# Patient Record
Sex: Female | Born: 1990 | Race: Black or African American | Hispanic: No | Marital: Single | Smoking: Never smoker
Health system: Southern US, Community
[De-identification: ages and names within clinical notes are randomized; demographics above are authoritative.]

## PROBLEM LIST (undated history)

## (undated) DIAGNOSIS — J45909 Unspecified asthma, uncomplicated: Secondary | ICD-10-CM

---

## 2016-10-27 ENCOUNTER — Encounter: Payer: Self-pay | Admitting: Internal Medicine

## 2016-10-31 ENCOUNTER — Institutional Professional Consult (permissible substitution): Payer: Self-pay | Admitting: Internal Medicine

## 2016-11-29 ENCOUNTER — Institutional Professional Consult (permissible substitution): Payer: Self-pay | Admitting: Internal Medicine

## 2016-12-21 ENCOUNTER — Institutional Professional Consult (permissible substitution): Payer: Self-pay | Admitting: Pulmonary Disease

## 2016-12-21 NOTE — Progress Notes (Deleted)
   Subjective:    Patient ID: Sherri Warner, female    DOB: May 30, 1990, 26 y.o.   MRN: [id number]  Synopsis: Referred in 2018 for***  HPI No chief complaint on file.    No past medical history on file.   No family history on file.   Social History   Socioeconomic History  . Marital status: Not on file    Spouse name: Not on file  . Number of children: Not on file  . Years of education: Not on file  . Highest education level: Not on file  Social Needs  . Financial resource strain: Not on file  . Food insecurity - worry: Not on file  . Food insecurity - inability: Not on file  . Transportation needs - medical: Not on file  . Transportation needs - non-medical: Not on file  Occupational History  . Not on file  Tobacco Use  . Smoking status: Not on file  Substance and Sexual Activity  . Alcohol use: Not on file  . Drug use: Not on file  . Sexual activity: Not on file  Other Topics Concern  . Not on file  Social History Narrative  . Not on file     Allergies not on file   No outpatient medications prior to visit.   No facility-administered medications prior to visit.       Review of Systems     Objective:   Physical Exam  There were no vitals filed for this visit.   CBC No results found for: WBC, RBC, HGB, HCT, PLT, MCV, MCH, MCHC, RDW, LYMPHSABS, MONOABS, EOSABS, BASOSABS   BMET No results found for: NA, K, CL, CO2, GLUCOSE, BUN, CREATININE, CALCIUM, GFRNONAA, GFRAA       Assessment & Plan:    No diagnosis found.  Discussion: ***   No current outpatient medications on file.

## 2017-02-18 ENCOUNTER — Other Ambulatory Visit: Payer: Self-pay

## 2017-02-18 ENCOUNTER — Encounter (HOSPITAL_COMMUNITY): Payer: Self-pay

## 2017-02-18 ENCOUNTER — Emergency Department (HOSPITAL_COMMUNITY): Payer: No Typology Code available for payment source

## 2017-02-18 ENCOUNTER — Emergency Department (HOSPITAL_COMMUNITY)
Admission: EM | Admit: 2017-02-18 | Discharge: 2017-02-18 | Disposition: A | Discharge status: Home / Self Care | Payer: No Typology Code available for payment source | Attending: Emergency Medicine | Admitting: Emergency Medicine

## 2017-02-18 DIAGNOSIS — S161XXA Strain of muscle, fascia and tendon at neck level, initial encounter: Secondary | ICD-10-CM

## 2017-02-18 DIAGNOSIS — R51 Headache: Secondary | ICD-10-CM | POA: Diagnosis present

## 2017-02-18 DIAGNOSIS — R519 Headache, unspecified: Secondary | ICD-10-CM

## 2017-02-18 MED ORDER — METHOCARBAMOL 500 MG PO TABS
1000.0000 mg | ORAL_TABLET | Freq: Once | ORAL | Status: AC
  Administered 2017-02-18: 1000 mg via ORAL
  Filled 2017-02-18: qty 2

## 2017-02-18 MED ORDER — METHOCARBAMOL 500 MG PO TABS: 500.0000 mg | ORAL_TABLET | Freq: Three times a day (TID) | ORAL | 0 refills | Status: DC | PRN

## 2017-02-18 MED ORDER — IBUPROFEN 800 MG PO TABS: 800.0000 mg | ORAL_TABLET | Freq: Three times a day (TID) | ORAL | 0 refills | Status: DC

## 2017-02-18 NOTE — ED Notes (Signed)
Called for triage x2
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
## 2017-02-18 NOTE — Discharge Instructions (Signed)
Motrin for pain.  Robaxin for muscle tightness or spasm.
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
## 2017-02-18 NOTE — ED Notes (Signed)
Patient transported to CT 

## 2017-02-20 LAB — POC OCCULT BLOOD, ED: Fecal Occult Bld: NEGATIVE

## 2017-08-03 ENCOUNTER — Emergency Department (HOSPITAL_COMMUNITY)
Admission: EM | Admit: 2017-08-03 | Discharge: 2017-08-03 | Disposition: A | Discharge status: Home / Self Care | Payer: BLUE CROSS/BLUE SHIELD | Attending: Emergency Medicine | Admitting: Emergency Medicine

## 2017-08-03 ENCOUNTER — Other Ambulatory Visit: Payer: Self-pay

## 2017-08-03 ENCOUNTER — Encounter (HOSPITAL_COMMUNITY): Payer: Self-pay | Admitting: Emergency Medicine

## 2017-08-03 DIAGNOSIS — N3001 Acute cystitis with hematuria: Secondary | ICD-10-CM | POA: Diagnosis not present

## 2017-08-03 DIAGNOSIS — Z87898 Personal history of other specified conditions: Secondary | ICD-10-CM | POA: Insufficient documentation

## 2017-08-03 DIAGNOSIS — R1084 Generalized abdominal pain: Secondary | ICD-10-CM

## 2017-08-03 LAB — CBC
HCT: 37 % (ref 36.0–46.0)
Hemoglobin: 12.3 g/dL (ref 12.0–15.0)
MCH: 29.6 pg (ref 26.0–34.0)
MCHC: 33.2 g/dL (ref 30.0–36.0)
MCV: 89.2 fL (ref 78.0–100.0)
PLATELETS: 391 10*3/uL (ref 150–400)
RBC: 4.15 MIL/uL (ref 3.87–5.11)
RDW: 13.7 % (ref 11.5–15.5)
WBC: 5.4 10*3/uL (ref 4.0–10.5)

## 2017-08-03 LAB — COMPREHENSIVE METABOLIC PANEL
ALBUMIN: 3.7 g/dL (ref 3.5–5.0)
ALK PHOS: 75 U/L (ref 38–126)
ALT: 14 U/L (ref 14–54)
AST: 15 U/L (ref 15–41)
Anion gap: 6 (ref 5–15)
BUN: 11 mg/dL (ref 6–20)
CALCIUM: 9.1 mg/dL (ref 8.9–10.3)
CHLORIDE: 108 mmol/L (ref 101–111)
CO2: 27 mmol/L (ref 22–32)
CREATININE: 0.58 mg/dL (ref 0.44–1.00)
GFR calc non Af Amer: >60 mL/min (ref >60)
GLUCOSE: 96 mg/dL (ref 65–99)
Potassium: 4.2 mmol/L (ref 3.5–5.1)
SODIUM: 141 mmol/L (ref 135–145)
Total Bilirubin: 0.4 mg/dL (ref 0.3–1.2)
Total Protein: 7.3 g/dL (ref 6.5–8.1)

## 2017-08-03 LAB — URINALYSIS, ROUTINE W REFLEX MICROSCOPIC
Bilirubin Urine: NEGATIVE
Glucose, UA: NEGATIVE mg/dL
KETONES UR: NEGATIVE mg/dL
Nitrite: NEGATIVE
PH: 5 (ref 5.0–8.0)
PROTEIN: NEGATIVE mg/dL
Specific Gravity, Urine: 1.015 (ref 1.005–1.030)
Squamous Epithelial / LPF: >50 — ABNORMAL HIGH (ref 0–5)
WBC, UA: >50 WBC/hpf — ABNORMAL HIGH (ref 0–5)

## 2017-08-03 LAB — I-STAT BETA HCG BLOOD, ED (MC, WL, AP ONLY): I-stat hCG, quantitative: <5 m[IU]/mL (ref <5)

## 2017-08-03 LAB — LIPASE, BLOOD: LIPASE: 26 U/L (ref 11–51)

## 2017-08-03 MED ORDER — GI COCKTAIL ~~LOC~~
30.0000 mL | Freq: Once | ORAL | Status: AC
  Administered 2017-08-03: 30 mL via ORAL
  Filled 2017-08-03: qty 30

## 2017-08-03 MED ORDER — SUCRALFATE 1 G PO TABS: 1.0000 g | ORAL_TABLET | Freq: Three times a day (TID) | ORAL | 0 refills | Status: DC

## 2017-08-03 MED ORDER — CEPHALEXIN 500 MG PO CAPS: 1000.0000 mg | ORAL_CAPSULE | Freq: Two times a day (BID) | ORAL | 0 refills | Status: AC

## 2017-08-03 MED ORDER — OMEPRAZOLE 20 MG PO CPDR: 20.0000 mg | DELAYED_RELEASE_CAPSULE | Freq: Two times a day (BID) | ORAL | 0 refills | Status: DC

## 2017-08-03 NOTE — ED Triage Notes (Signed)
Per EMS pt complaint of mid abdominal pain throughout the night; denies n/v/d.

## 2017-08-03 NOTE — ED Provider Notes (Signed)
[facility]-EMERGENCY DEPT Provider Note   CSN: [id number] Arrival date & time: 08/03/17  1201     History   Chief Complaint Chief Complaint  Patient presents with  . Abdominal Pain    HPI Sherri Warner is a 27 y.o. female.  Patient presents with abdominal pain that is largely epigastric, started earlier today, without nausea vomiting or fever. She reports history stomach ulcer, [place]. She has been here for 2 years. She reports the pain is improved when she eats. No other symptoms, specifically no urinary symptoms, SOB, chest pain or back pain. She is not on any regular medications and has not tried anything to alleviate symptoms today.  The history is provided by the patient. No language interpreter was used.  Abdominal Pain   Pertinent negatives include fever, nausea, vomiting, dysuria and frequency.    History reviewed. No pertinent past medical history.  There are no active problems to display for this patient.   History reviewed. No pertinent surgical history.   OB History   None      Home Medications    Prior to Admission medications   Medication Sig Start Date End Date Taking? Authorizing Provider  ibuprofen (ADVIL,MOTRIN) 800 MG tablet Take 1 tablet (800 mg total) by mouth 3 (three) times daily. Patient taking differently: Take 800 mg by mouth 2 (two) times daily.  02/18/17   Rolland PorterJames, Mark, MD  methocarbamol (ROBAXIN) 500 MG tablet Take 1 tablet (500 mg total) by mouth 3 (three) times daily between meals as needed. Patient not taking: Reported on 08/03/2017 02/18/17   Rolland PorterJames, Mark, MD    Family History No family history on file.  Social History Social History   Tobacco Use  . Smoking status: Never Smoker  . Smokeless tobacco: Never Used  Substance Use Topics  . Alcohol use: No    Frequency: Never  . Drug use: No     Allergies   Patient has no known allergies.   Review of Systems Review of Systems    Constitutional: Negative for chills and fever.  HENT: Negative.   Respiratory: Negative.  Negative for shortness of breath.   Cardiovascular: Negative.  Negative for chest pain.  Gastrointestinal: Positive for abdominal pain. Negative for nausea and vomiting.  Genitourinary: Negative.  Negative for dysuria and frequency.  Musculoskeletal: Negative.   Skin: Negative.   Neurological: Negative.      Physical Exam Updated Vital Signs BP 120/80   Pulse 72   Temp 98.1 F (36.7 C) (Oral)   Resp 14   SpO2 100%   Physical Exam  Constitutional: She appears well-developed and well-nourished.  HENT:  Head: Normocephalic.  Neck: Normal range of motion. Neck supple.  Cardiovascular: Normal rate and regular rhythm.  Pulmonary/Chest: Effort normal and breath sounds normal.  Abdominal: Soft. Bowel sounds are normal. There is generalized tenderness (Tenderness somewhat worse in epigastric area. ). There is no rebound and no guarding.  Musculoskeletal: Normal range of motion.  Neurological: She is alert. No cranial nerve deficit.  Skin: Skin is warm and dry. No rash noted.  Psychiatric: She has a normal mood and affect.     ED Treatments / Results  Labs (all labs ordered are listed, but only abnormal results are displayed) Labs Reviewed  URINALYSIS, ROUTINE W REFLEX MICROSCOPIC - Abnormal; Notable for the following components:      Result Value   APPearance CLOUDY (*)    Hgb urine dipstick SMALL (*)  Leukocytes, UA LARGE (*)    WBC, UA >50 (*)    Bacteria, UA FEW (*)    Squamous Epithelial / LPF >50 (*)    All other components within normal limits  LIPASE, BLOOD  COMPREHENSIVE METABOLIC PANEL  CBC  I-STAT BETA HCG BLOOD, ED ([facility], [facility], [facility] ONLY)    EKG None  Radiology No results found.  Procedures Procedures (including critical care time)  Medications Ordered in ED Medications - No data to display   Initial Impression / Assessment and Plan / ED Course  I have  reviewed the triage vital signs and the nursing notes.  Pertinent labs & imaging results that were available during my care of the patient were reviewed by me and considered in my medical decision making (see chart for details).     Patient here with abdominal pain that is familiar to her as ulcer pain she has had in the past. No nausea, vomiting, fever, melena  GI cocktail provided with relief of abdominal discomfort. Will refer to GI for further management of possible recurrent ulcer disease. Will start on Prilosec and carafate.   Labs are essentially normal with some evidence UTI. Given >50 RBC and WBC, will provide Keflex x 3 days.   She is felt stable for discharge home.   Final Clinical Impressions(s) / ED Diagnoses   Final diagnoses:  None   1. Abdominal pain 2. History of ulcer disease 3. UTI   ED Discharge Orders    None       Elpidio Anis, Cordelia Poche 08/03/17 1558    Arby Barrette, MD 08/03/17 [phone]

## 2017-08-03 NOTE — Discharge Instructions (Addendum)
Follow up with Gastroenterology for further evaluation of abdominal pain. Take medications as prescribed. Also, it is recommended that you get established with a primary care doctor for routine medical concerns.

## 2017-11-03 ENCOUNTER — Encounter (HOSPITAL_COMMUNITY): Payer: Self-pay | Admitting: Emergency Medicine

## 2017-11-03 ENCOUNTER — Other Ambulatory Visit: Payer: Self-pay

## 2017-11-03 ENCOUNTER — Emergency Department (HOSPITAL_COMMUNITY)
Admission: EM | Admit: 2017-11-03 | Discharge: 2017-11-03 | Disposition: A | Discharge status: Home / Self Care | Payer: BLUE CROSS/BLUE SHIELD | Attending: Emergency Medicine | Admitting: Emergency Medicine

## 2017-11-03 DIAGNOSIS — J069 Acute upper respiratory infection, unspecified: Secondary | ICD-10-CM | POA: Diagnosis not present

## 2017-11-03 DIAGNOSIS — B9789 Other viral agents as the cause of diseases classified elsewhere: Secondary | ICD-10-CM | POA: Diagnosis not present

## 2017-11-03 DIAGNOSIS — R05 Cough: Secondary | ICD-10-CM | POA: Diagnosis present

## 2017-11-03 MED ORDER — FLUTICASONE PROPIONATE 50 MCG/ACT NA SUSP: 2.0000 | Freq: Every day | NASAL | 0 refills | Status: DC

## 2017-11-03 MED ORDER — BENZONATATE 100 MG PO CAPS: 100.0000 mg | ORAL_CAPSULE | Freq: Three times a day (TID) | ORAL | 0 refills | Status: DC

## 2017-11-03 MED ORDER — ALBUTEROL SULFATE HFA 108 (90 BASE) MCG/ACT IN AERS
2.0000 | INHALATION_SPRAY | Freq: Once | RESPIRATORY_TRACT | Status: AC
  Administered 2017-11-03: 2 via RESPIRATORY_TRACT
  Filled 2017-11-03: qty 6.7

## 2017-11-03 NOTE — ED Provider Notes (Signed)
[facility]-EMERGENCY DEPT Provider Note   CSN: [id number] Arrival date & time: 11/03/17  1847     History   Chief Complaint Chief Complaint  Patient presents with  . Nasal Congestion    HPI Sherri Warner is a 27 y.o. female.  HPI   Patient is a 27 year old female who presents emergency department today for evaluation of rhinorrhea, nasal congestion, postnasal drip, sore throat and a dry cough that began yesterday.  Denies any documented fevers at home.  States she is to have a history of asthma and needs to take inhalers but she ran out.  Denies any exacerbating or alleviating factors.  Has not taken any medication for her symptoms.  History reviewed. No pertinent past medical history.  There are no active problems to display for this patient.   History reviewed. No pertinent surgical history.   OB History   None      Home Medications    Prior to Admission medications   Medication Sig Start Date End Date Taking? Authorizing Provider  benzonatate (TESSALON) 100 MG capsule Take 1 capsule (100 mg total) by mouth every 8 (eight) hours. 11/03/17   Catherina Pates S, PA-C  fluticasone (FLONASE) 50 MCG/ACT nasal spray Place 2 sprays into both nostrils daily. 11/03/17   Sumaiyah Markert S, PA-C  ibuprofen (ADVIL,MOTRIN) 800 MG tablet Take 1 tablet (800 mg total) by mouth 3 (three) times daily. Patient taking differently: Take 800 mg by mouth 2 (two) times daily.  02/18/17   Rolland PorterJames, Mark, MD  methocarbamol (ROBAXIN) 500 MG tablet Take 1 tablet (500 mg total) by mouth 3 (three) times daily between meals as needed. Patient not taking: Reported on 08/03/2017 02/18/17   Rolland PorterJames, Mark, MD  omeprazole (PRILOSEC) 20 MG capsule Take 1 capsule (20 mg total) by mouth 2 (two) times daily before a meal. 08/03/17   Upstill, Melvenia BeamShari, PA-C  sucralfate (CARAFATE) 1 g tablet Take 1 tablet (1 g total) by mouth 4 (four) times daily -  with meals and at bedtime. 08/03/17   Elpidio AnisUpstill, Shari,  PA-C    Family History No family history on file.  Social History Social History   Tobacco Use  . Smoking status: Never Smoker  . Smokeless tobacco: Never Used  Substance Use Topics  . Alcohol use: No    Frequency: Never  . Drug use: No     Allergies   Patient has no known allergies.   Review of Systems Review of Systems  Constitutional: Negative for chills and fever.  HENT: Positive for congestion, postnasal drip, rhinorrhea and sore throat. Negative for ear pain and trouble swallowing.   Eyes: Negative for pain and visual disturbance.  Respiratory: Positive for cough. Negative for shortness of breath.   Cardiovascular: Negative for chest pain.  Gastrointestinal: Negative for abdominal pain, constipation, diarrhea, nausea and vomiting.  Genitourinary: Negative for dysuria and hematuria.  Musculoskeletal: Negative for back pain.  Skin: Negative for rash.  Neurological: Positive for headaches (resolved).  All other systems reviewed and are negative.   Physical Exam Updated Vital Signs BP 114/79 (BP Location: Left Arm)   Pulse 88   Temp 97.6 F (36.4 C) (Oral)   Resp 13   SpO2 100%   Physical Exam  Constitutional: She appears well-developed and well-nourished. No distress.  HENT:  Head: Normocephalic and atraumatic.  Mouth/Throat: Oropharynx is clear and moist.  No pharyngeal erythema.  No tonsillar swelling or exudates.  Nasal turbinates are swollen bilaterally.  Bilateral TMs are  without erythema or effusion.  Eyes: Pupils are equal, round, and reactive to light. Conjunctivae and EOM are normal.  Neck: Neck supple.  Cardiovascular: Normal rate, regular rhythm and normal heart sounds.  No murmur heard. Pulmonary/Chest: Effort normal and breath sounds normal. No stridor. No respiratory distress. She has no wheezes.  Abdominal: Soft. Bowel sounds are normal. She exhibits no distension. There is no tenderness. There is no guarding.  Musculoskeletal: She exhibits  no edema.  Lymphadenopathy:    She has no cervical adenopathy.  Neurological: She is alert.  Skin: Skin is warm and dry.  Psychiatric: She has a normal mood and affect.  Nursing note and vitals reviewed.   ED Treatments / Results  Labs (all labs ordered are listed, but only abnormal results are displayed) Labs Reviewed - No data to display  EKG None  Radiology No results found.  Procedures Procedures (including critical care time)  Medications Ordered in ED Medications  albuterol (PROVENTIL HFA;VENTOLIN HFA) 108 (90 Base) MCG/ACT inhaler 2 puff (has no administration in time range)     Initial Impression / Assessment and Plan / ED Course  I have reviewed the triage vital signs and the nursing notes.  Pertinent labs & imaging results that were available during my care of the patient were reviewed by me and considered in my medical decision making (see chart for details).    Final Clinical Impressions(s) / ED Diagnoses   Final diagnoses:  Viral URI with cough    Patients symptoms are consistent with URI, likely viral etiology. sxs ongoing since yesterday. No fevers. Nontoxic and nonseptic appearing. Exam not consistent with strep throat. Discussed that antibiotics are not indicated for viral infections. Pt will be discharged with symptomatic treatment.  Verbalizes understanding and is agreeable with plan. Pt is hemodynamically stable & in NAD prior to dc.   ED Discharge Orders         Ordered    benzonatate (TESSALON) 100 MG capsule  Every 8 hours     11/03/17 2118    fluticasone (FLONASE) 50 MCG/ACT nasal spray  Daily     11/03/17 2118           Rayne Du 11/03/17 2123    Loren Racer, MD 11/03/17 2342

## 2017-11-03 NOTE — Discharge Instructions (Signed)

## 2017-11-03 NOTE — ED Triage Notes (Signed)
Pt arrives with complaints of sore throat from post-nasal drip and sinus infection symptoms. Patient states symptoms began yesterday. Patient complaining of a dry cough.

## 2018-02-02 ENCOUNTER — Emergency Department (HOSPITAL_COMMUNITY)
Admission: EM | Admit: 2018-02-02 | Discharge: 2018-02-02 | Disposition: A | Discharge status: Home / Self Care | Payer: BLUE CROSS/BLUE SHIELD | Attending: Emergency Medicine | Admitting: Emergency Medicine

## 2018-02-02 ENCOUNTER — Encounter (HOSPITAL_COMMUNITY): Payer: Self-pay | Admitting: Emergency Medicine

## 2018-02-02 ENCOUNTER — Emergency Department (HOSPITAL_COMMUNITY): Payer: BLUE CROSS/BLUE SHIELD

## 2018-02-02 ENCOUNTER — Other Ambulatory Visit: Payer: Self-pay

## 2018-02-02 DIAGNOSIS — R079 Chest pain, unspecified: Secondary | ICD-10-CM

## 2018-02-02 DIAGNOSIS — R0789 Other chest pain: Secondary | ICD-10-CM | POA: Insufficient documentation

## 2018-02-02 DIAGNOSIS — Z79899 Other long term (current) drug therapy: Secondary | ICD-10-CM | POA: Insufficient documentation

## 2018-02-02 DIAGNOSIS — R0600 Dyspnea, unspecified: Secondary | ICD-10-CM

## 2018-02-02 LAB — CBC
HEMATOCRIT: 38.7 % (ref 36.0–46.0)
Hemoglobin: 12.6 g/dL (ref 12.0–15.0)
MCH: 28.9 pg (ref 26.0–34.0)
MCHC: 32.6 g/dL (ref 30.0–36.0)
MCV: 88.8 fL (ref 80.0–100.0)
Platelets: 412 10*3/uL — ABNORMAL HIGH (ref 150–400)
RBC: 4.36 MIL/uL (ref 3.87–5.11)
RDW: 13.3 % (ref 11.5–15.5)
WBC: 5.6 10*3/uL (ref 4.0–10.5)
nRBC: 0 % (ref 0.0–0.2)

## 2018-02-02 LAB — BASIC METABOLIC PANEL
Anion gap: 9 (ref 5–15)
BUN: 10 mg/dL (ref 6–20)
CHLORIDE: 105 mmol/L (ref 98–111)
CO2: 26 mmol/L (ref 22–32)
CREATININE: 0.78 mg/dL (ref 0.44–1.00)
Calcium: 9.3 mg/dL (ref 8.9–10.3)
GFR calc non Af Amer: >60 mL/min (ref >60)
GLUCOSE: 87 mg/dL (ref 70–99)
Potassium: 4.1 mmol/L (ref 3.5–5.1)
Sodium: 140 mmol/L (ref 135–145)

## 2018-02-02 LAB — I-STAT TROPONIN, ED
Troponin i, poc: 0 ng/mL (ref 0.00–0.08)
Troponin i, poc: 0 ng/mL (ref 0.00–0.08)

## 2018-02-02 LAB — I-STAT BETA HCG BLOOD, ED (MC, WL, AP ONLY)

## 2018-02-02 LAB — D-DIMER, QUANTITATIVE: D-Dimer, Quant: 0.72 ug/mL-FEU — ABNORMAL HIGH (ref 0.00–0.50)

## 2018-02-02 MED ORDER — SODIUM CHLORIDE 0.9 % IV BOLUS
1000.0000 mL | Freq: Once | INTRAVENOUS | Status: AC
  Administered 2018-02-02: 1000 mL via INTRAVENOUS

## 2018-02-02 MED ORDER — IOPAMIDOL (ISOVUE-370) INJECTION 76%
INTRAVENOUS | Status: AC
  Administered 2018-02-02: 100 mL
  Filled 2018-02-02: qty 100

## 2018-02-02 NOTE — ED Triage Notes (Signed)
Pt reports 5/10 central cp that started last night with sob.

## 2018-02-02 NOTE — ED Provider Notes (Signed)
[facility] Provider Note   CSN: [id number] Arrival date & time: 02/02/18  1717     History   Chief Complaint Chief Complaint  Patient presents with  . Chest Pain    HPI Sherri Warner is a 27 y.o. female with a past medical history of asthma, who presents to ED for chest pain and shortness of breath that has worsened since last night.  States that she has had intermittent central chest pain and shortness of breath with exertion for the past several months.  Pain is worse with inspiration.  She notices this during sexual intercourse, walking up the steps or at work.  She has tried taking Advil with no improvement in her symptoms.  She denies any leg swelling, hemoptysis, recent immobilization, OCP use, history of DVT, PE, MI, alcohol, tobacco or other drug use.  She states that about 6 years ago when she lived in [place], she "had some kind of heart thing that they gave me medicine for, but they said it was just the beginning of something."  She does not know exactly what her diagnosis was nor the medication that she was on.  She is no longer taking this medication. Patient states that this does not feel like it is due to her asthma.  HPI  History reviewed. No pertinent past medical history.  There are no active problems to display for this patient.   History reviewed. No pertinent surgical history.   OB History   No obstetric history on file.      Home Medications    Prior to Admission medications   Medication Sig Start Date End Date Taking? Authorizing Provider  benzonatate (TESSALON) 100 MG capsule Take 1 capsule (100 mg total) by mouth every 8 (eight) hours. 11/03/17   Couture, Cortni S, PA-C  fluticasone (FLONASE) 50 MCG/ACT nasal spray Place 2 sprays into both nostrils daily. 11/03/17   Couture, Cortni S, PA-C  ibuprofen (ADVIL,MOTRIN) 800 MG tablet Take 1 tablet (800 mg total) by mouth 3 (three) times daily. Patient taking  differently: Take 800 mg by mouth 2 (two) times daily.  02/18/17   Rolland PorterJames, Mark, MD  methocarbamol (ROBAXIN) 500 MG tablet Take 1 tablet (500 mg total) by mouth 3 (three) times daily between meals as needed. Patient not taking: Reported on 08/03/2017 02/18/17   Rolland PorterJames, Mark, MD  omeprazole (PRILOSEC) 20 MG capsule Take 1 capsule (20 mg total) by mouth 2 (two) times daily before a meal. 08/03/17   Upstill, Melvenia BeamShari, PA-C  sucralfate (CARAFATE) 1 g tablet Take 1 tablet (1 g total) by mouth 4 (four) times daily -  with meals and at bedtime. 08/03/17   Elpidio AnisUpstill, Shari, PA-C    Family History No family history on file.  Social History Social History   Tobacco Use  . Smoking status: Never Smoker  . Smokeless tobacco: Never Used  Substance Use Topics  . Alcohol use: No    Frequency: Never  . Drug use: No     Allergies   Patient has no known allergies.   Review of Systems Review of Systems  Constitutional: Negative for appetite change, chills and fever.  HENT: Negative for ear pain, rhinorrhea, sneezing and sore throat.   Eyes: Negative for photophobia and visual disturbance.  Respiratory: Positive for shortness of breath. Negative for cough, chest tightness and wheezing.   Cardiovascular: Positive for chest pain. Negative for palpitations.  Gastrointestinal: Negative for abdominal pain, blood in stool, constipation, diarrhea, nausea and  vomiting.  Genitourinary: Negative for dysuria, hematuria and urgency.  Musculoskeletal: Negative for myalgias.  Skin: Negative for rash.  Neurological: Negative for dizziness, weakness and light-headedness.     Physical Exam Updated Vital Signs BP 124/76   Pulse 83   Temp (!) 97.5 F (36.4 C) (Oral)   Resp 18   Ht 5\' 4"  (1.626 m)   Wt 71.7 kg   LMP 01/19/2018   SpO2 100%   BMI 27.12 kg/m   Physical Exam Vitals signs and nursing note reviewed.  Constitutional:      General: She is not in acute distress.    Appearance: She is well-developed.      Comments: Nontoxic-appearing in no acute distress.  Speaking in complete sentences without difficulty.  HENT:     Head: Normocephalic and atraumatic.     Nose: Nose normal.  Eyes:     General: No scleral icterus.       Left eye: No discharge.     Conjunctiva/sclera: Conjunctivae normal.  Neck:     Musculoskeletal: Normal range of motion and neck supple.  Cardiovascular:     Rate and Rhythm: Normal rate and regular rhythm.     Heart sounds: Normal heart sounds. No murmur. No friction rub. No gallop.   Pulmonary:     Effort: Pulmonary effort is normal. No respiratory distress.     Breath sounds: Normal breath sounds.  Abdominal:     General: Bowel sounds are normal. There is no distension.     Palpations: Abdomen is soft.     Tenderness: There is no abdominal tenderness. There is no guarding.  Musculoskeletal: Normal range of motion.     Comments: No lower extremity edema, erythema or calf tenderness bilaterally.  Skin:    General: Skin is warm and dry.     Findings: No rash.  Neurological:     Mental Status: She is alert.     Motor: No abnormal muscle tone.     Coordination: Coordination normal.      ED Treatments / Results  Labs (all labs ordered are listed, but only abnormal results are displayed) Labs Reviewed  CBC - Abnormal; Notable for the following components:      Result Value   Platelets 412 (*)    All other components within normal limits  D-DIMER, QUANTITATIVE (NOT AT [facility]) - Abnormal; Notable for the following components:   D-Dimer, Quant 0.72 (*)    All other components within normal limits  BASIC METABOLIC PANEL  I-STAT TROPONIN, ED  I-STAT BETA HCG BLOOD, ED ([facility], [facility], [facility] ONLY)  I-STAT TROPONIN, ED    EKG None  Radiology Dg Chest 2 View  Result Date: 02/02/2018 CLINICAL DATA:  Chest pain EXAM: CHEST - 2 VIEW COMPARISON:  None. FINDINGS: The heart size and mediastinal contours are within normal limits. Both lungs are clear. The visualized skeletal  structures are unremarkable. IMPRESSION: No active cardiopulmonary disease. Electronically Signed   By: Jasmine Pang M.D.   On: 02/02/2018 18:46    Procedures Procedures (including critical care time)  Medications Ordered in ED Medications  sodium chloride 0.9 % bolus 1,000 mL (1,000 mLs Intravenous New Bag/Given 02/02/18 2128)     Initial Impression / Assessment and Plan / ED Course  I have reviewed the triage vital signs and the nursing notes.  Pertinent labs & imaging results that were available during my care of the patient were reviewed by me and considered in my medical decision making (see chart for  details).     27 year old female presents to ED for intermittent chest pain for the past several months that has worsened since last night.  Pain is pleuritic in nature.  She reports associated shortness of breath worse with exertion as well.  No improvement with NSAIDs.  Denies any URI symptoms, leg swelling, cough, history of PE, DVT or MI.  However, she does state that she had some type of heart problem 6 years ago when she lived in [place] and she was treated with medication.  She is unable to recall the diagnosis or the medication.  She is no longer taking this medication.  On exam she is overall well-appearing.  She is not tachycardic, tachypneic or hypoxic.  No lower extremity edema, erythema or calf tenderness that would concern me for DVT.  Initial lab work including CBC, BMP, initial troponin and hCG are unremarkable.  EKG shows normal sinus rhythm.  Chest x-ray is unremarkable.  Patient appears low risk for cardiac or pulmonary cause of symptoms.  However, due to unknown past medical history will order repeat troponin and d-dimer as her chest pain is pleuritic.  Delta troponin is negative.  D-dimer elevated 2.72.  I have ordered a CT of her chest to rule out a PE. I anticipate dispo home if CTA returns as negative, as she is low risk by HEART score. Care handed off to Dr.  Effie ShyWentz.    Portions of this note were generated with Scientist, clinical (histocompatibility and immunogenetics)Dragon dictation software. Dictation errors may occur despite best attempts at proofreading.   Final Clinical Impressions(s) / ED Diagnoses   Final diagnoses:  Chest wall pain    ED Discharge Orders    None       Dietrich PatesKhatri, Randeep Biondolillo, PA-C 02/02/18 2158    Mancel BaleWentz, Elliott, MD 02/06/18 [phone]

## 2018-02-02 NOTE — ED Provider Notes (Signed)
  Face-to-face evaluation   History: She is here for evaluation of chest pain with shortness of breath.  Symptoms ongoing for several months.  Physical exam: Alert, calm, cooperative.  No respiratory distress.  No dysarthria or aphasia.  She is comfortable and calm.   Clinical Course as of Feb 02 2317  Fri Feb 02, 2018  2316 Normal  I-stat troponin, ED [EW]  2317 Normal  Basic metabolic panel [EW]  2317 High  D-dimer, quantitative (not at [facility])(!) [EW]  2317 Normal  I-Stat beta hCG blood, ED [EW]  2317 Normal  CBC(!) [EW]  2317 No PE, no clear evidence for infiltrate.  CT Angio Chest PE W/Cm &/Or Wo Cm [EW]  2317 DG Chest 2 View [EW]  2318 No pneumonia or CHF, images reviewed by me   [EW]    Clinical Course User Index [EW] Mancel BaleWentz, Alioune Hodgkin, MD    Medical screening examination/treatment/procedure(s) were conducted as a shared visit with non-physician practitioner(s) and myself.  I personally evaluated the patient during the encounter    Mancel BaleWentz, Ena Demary, MD 02/02/18 2321

## 2018-02-02 NOTE — Discharge Instructions (Addendum)
The testing today is normal.  We did not find any sign of heart or lung problems to explain your pain.  You can try taking Tylenol for pain.  Follow-up with your doctor if you are not better in 1 week.

## 2018-02-08 ENCOUNTER — Emergency Department (HOSPITAL_COMMUNITY)
Admission: EM | Admit: 2018-02-08 | Discharge: 2018-02-08 | Disposition: A | Discharge status: Home / Self Care | Payer: BLUE CROSS/BLUE SHIELD | Attending: Emergency Medicine | Admitting: Emergency Medicine

## 2018-02-08 ENCOUNTER — Encounter (HOSPITAL_COMMUNITY): Payer: Self-pay | Admitting: Emergency Medicine

## 2018-02-08 ENCOUNTER — Other Ambulatory Visit: Payer: Self-pay

## 2018-02-08 DIAGNOSIS — J029 Acute pharyngitis, unspecified: Secondary | ICD-10-CM

## 2018-02-08 DIAGNOSIS — J069 Acute upper respiratory infection, unspecified: Secondary | ICD-10-CM

## 2018-02-08 DIAGNOSIS — R509 Fever, unspecified: Secondary | ICD-10-CM | POA: Diagnosis present

## 2018-02-08 HISTORY — DX: Unspecified asthma, uncomplicated: J45.909

## 2018-02-08 LAB — GROUP A STREP BY PCR: Group A Strep by PCR: NOT DETECTED

## 2018-02-08 NOTE — ED Notes (Signed)
Bed: [facility]1 Expected date:  Expected time:  Means of arrival:  Comments: 

## 2018-02-08 NOTE — ED Triage Notes (Signed)
Pt c/o stuffy head nasal congestion and generalized body aches onset x2 days. ibuprophen ineffective

## 2018-02-08 NOTE — Discharge Instructions (Addendum)
Recommend tylenol and/or ibuprofen for aches and any fever you may develop. A decongestant (ie Sudafed) or an antihistamine (ie Benadryl or Claritin) can help with symptoms of congestion.   Push fluids. Get plenty of rest.   Please follow up with a primary care provider of your choice for non-urgent medical conditions. Return to the ED anytime for emergency medical needs.

## 2018-02-08 NOTE — ED Provider Notes (Signed)
[facility]-EMERGENCY DEPT Provider Note   CSN: [id number] Arrival date & time: 02/08/18  0204     History   Chief Complaint Chief Complaint  Patient presents with  . Influenza    HPI Sherri Warner is a 27 y.o. female.  Patient to ED with symptoms of congestion, cough, sore throat, fever, nausea without vomiting and one episode loose stool yesterday. Generally, symptoms started 2-3 days ago. She has been taking ibuprofen for comfort. She is eating and drinking. No urinary symptoms.   The history is provided by the patient. No language interpreter was used.  Influenza  Presenting symptoms: cough, fever, myalgias, nausea and sore throat   Presenting symptoms: no diarrhea and no vomiting   Associated symptoms: chills and nasal congestion     Past Medical History:  Diagnosis Date  . Asthma     There are no active problems to display for this patient.   History reviewed. No pertinent surgical history.   OB History    Gravida  0   Para  0   Term  0   Preterm  0   AB  0   Living  0     SAB  0   TAB  0   Ectopic  0   Multiple  0   Live Births  0            Home Medications    Prior to Admission medications   Medication Sig Start Date End Date Taking? Authorizing Provider  benzonatate (TESSALON) 100 MG capsule Take 1 capsule (100 mg total) by mouth every 8 (eight) hours. 11/03/17   Couture, Cortni S, PA-C  fluticasone (FLONASE) 50 MCG/ACT nasal spray Place 2 sprays into both nostrils daily. 11/03/17   Couture, Cortni S, PA-C  ibuprofen (ADVIL,MOTRIN) 800 MG tablet Take 1 tablet (800 mg total) by mouth 3 (three) times daily. Patient taking differently: Take 800 mg by mouth 2 (two) times daily.  02/18/17   Rolland PorterJames, Mark, MD  methocarbamol (ROBAXIN) 500 MG tablet Take 1 tablet (500 mg total) by mouth 3 (three) times daily between meals as needed. Patient not taking: Reported on 08/03/2017 02/18/17   Rolland PorterJames, Mark, MD  omeprazole (PRILOSEC)  20 MG capsule Take 1 capsule (20 mg total) by mouth 2 (two) times daily before a meal. 08/03/17   Demerius Podolak, Melvenia BeamShari, PA-C  sucralfate (CARAFATE) 1 g tablet Take 1 tablet (1 g total) by mouth 4 (four) times daily -  with meals and at bedtime. 08/03/17   Elpidio AnisUpstill, Reese Stockman, PA-C    Family History History reviewed. No pertinent family history.  Social History Social History   Tobacco Use  . Smoking status: Never Smoker  . Smokeless tobacco: Never Used  Substance Use Topics  . Alcohol use: No    Frequency: Never  . Drug use: No     Allergies   Patient has no known allergies.   Review of Systems Review of Systems  Constitutional: Positive for chills and fever.  HENT: Positive for congestion and sore throat.   Respiratory: Positive for cough.   Cardiovascular: Negative.   Gastrointestinal: Positive for nausea. Negative for diarrhea and vomiting.  Genitourinary: Negative.   Musculoskeletal: Positive for myalgias.  Skin: Negative.   Neurological: Negative.      Physical Exam Updated Vital Signs BP 117/72 (BP Location: Left Arm)   Pulse 91   Temp 98.4 F (36.9 C) (Oral)   Resp 16   Ht 5\' 4"  (1.626 m)  Wt 72.6 kg   LMP 01/19/2018   SpO2 100%   BMI 27.46 kg/m   Physical Exam Vitals signs and nursing note reviewed.  Constitutional:      Appearance: She is well-developed.  HENT:     Head: Normocephalic.     Nose: Congestion present.     Mouth/Throat:     Mouth: Mucous membranes are moist.     Pharynx: Posterior oropharyngeal erythema present. No oropharyngeal exudate.  Eyes:     Conjunctiva/sclera: Conjunctivae normal.  Neck:     Musculoskeletal: Normal range of motion and neck supple.  Cardiovascular:     Rate and Rhythm: Normal rate and regular rhythm.     Heart sounds: No murmur.  Pulmonary:     Effort: Pulmonary effort is normal.     Breath sounds: Normal breath sounds. No wheezing, rhonchi or rales.  Abdominal:     General: Bowel sounds are normal.      Palpations: Abdomen is soft.     Tenderness: There is no abdominal tenderness. There is no guarding or rebound.  Musculoskeletal: Normal range of motion.  Skin:    General: Skin is warm and dry.     Findings: No rash.  Neurological:     Mental Status: She is alert.     Cranial Nerves: No cranial nerve deficit.      ED Treatments / Results  Labs (all labs ordered are listed, but only abnormal results are displayed) Labs Reviewed  GROUP A STREP BY PCR    EKG None  Radiology No results found.  Procedures Procedures (including critical care time)  Medications Ordered in ED Medications - No data to display   Initial Impression / Assessment and Plan / ED Course  I have reviewed the triage vital signs and the nursing notes.  Pertinent labs & imaging results that were available during my care of the patient were reviewed by me and considered in my medical decision making (see chart for details).     Very well appearing patient to ED with cold symptoms, fever, including body aches and sore throat.   Strep test is negative. VSS, afebrile. She likely has a viral URI, influenza is possible but she looks well. Recommend supportive care.   Final Clinical Impressions(s) / ED Diagnoses   Final diagnoses:  None   1. URI 2. Pharyngitis   ED Discharge Orders    None       Danne HarborUpstill, Balen Woolum, PA-C 02/08/18 0524    Dione BoozeGlick, David, MD 02/08/18 2236

## 2019-09-19 ENCOUNTER — Encounter: Payer: Self-pay | Admitting: *Deleted

## 2019-10-03 ENCOUNTER — Encounter: Payer: Self-pay | Admitting: *Deleted

## 2019-10-09 ENCOUNTER — Encounter: Payer: BLUE CROSS/BLUE SHIELD | Admitting: Obstetrics & Gynecology

## 2019-12-07 IMAGING — CT CT ANGIO CHEST
2 of 8 series · 18 of 46 positions shown · IV contrast (iopamidol)
Comparison: Chest radiograph 02/02/2018

CLINICAL DATA: Shortness of breath with chest pain. Positive
D-dimer.

EXAM:
CT ANGIOGRAPHY CHEST WITH CONTRAST
TECHNIQUE: Multidetector CT imaging of the chest was performed using the
standard protocol during bolus administration of intravenous
contrast. Multiplanar CT image reconstructions and MIPs were
obtained to evaluate the vascular anatomy.
CONTRAST:  100mL [id number] IOPAMIDOL ([id number]) INJECTION 76%

[Series 6: thins · axial · 0.81mm/px · z∈[+1057,+1309]mm · 15 of 278 slices shown]
[im 13/278  lung]
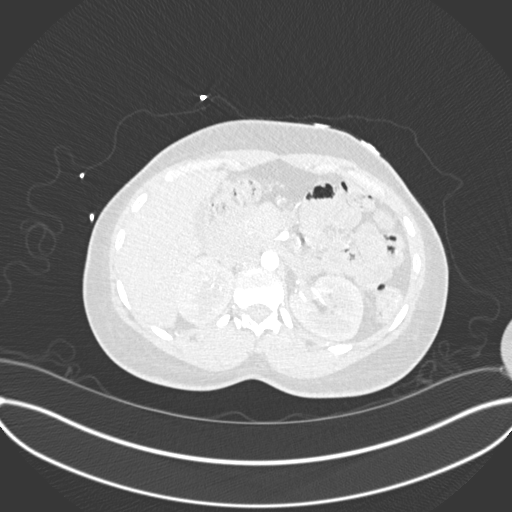
[im 38/278  soft-tissue]
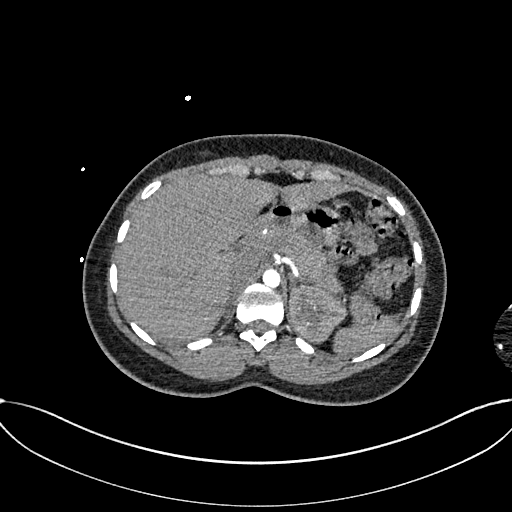
[im 51/278  lung]
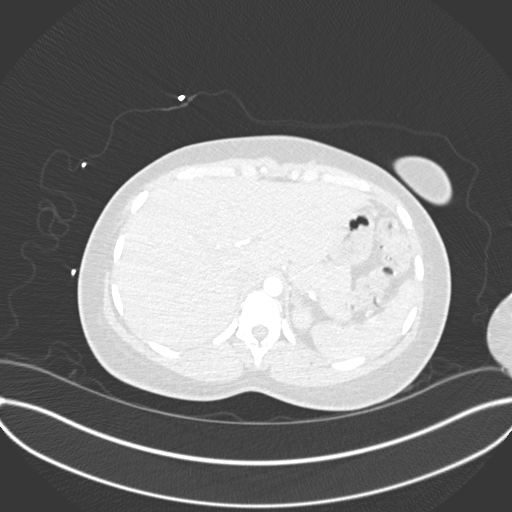
[im 63/278  soft-tissue]
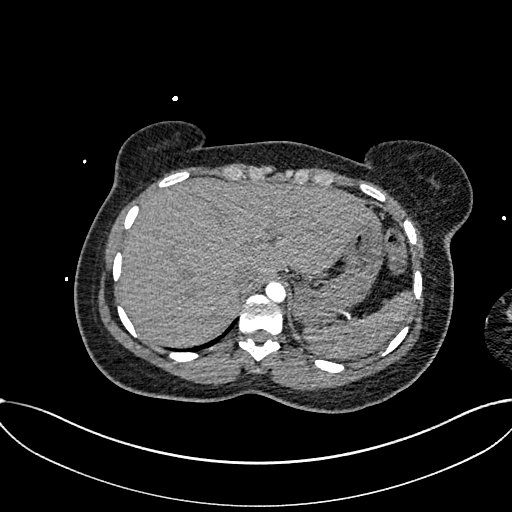
[im 89/278  lung]
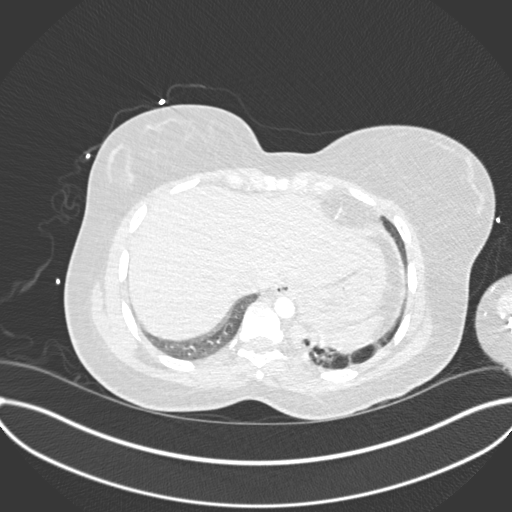
[im 101/278  soft-tissue]
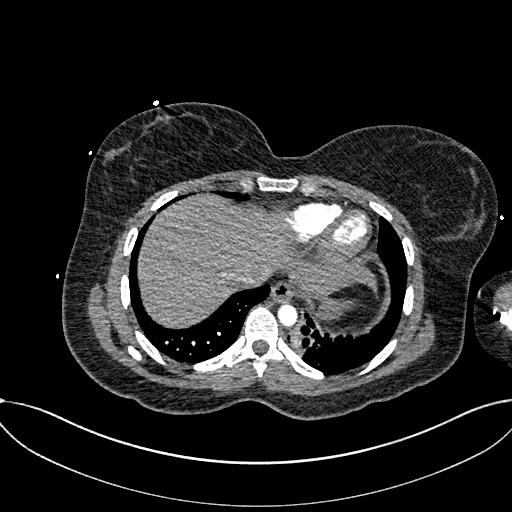
[im 126/278  lung]
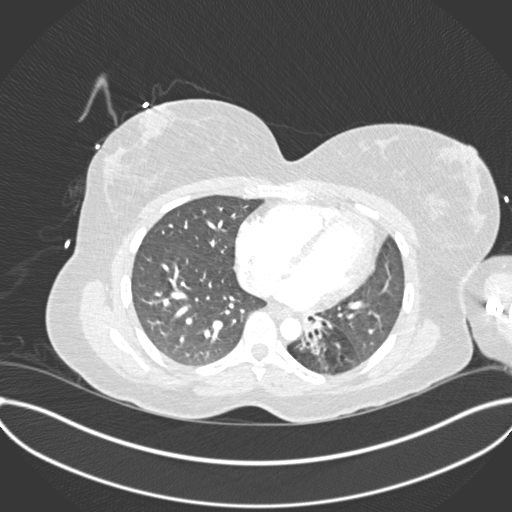
[im 139/278  soft-tissue]
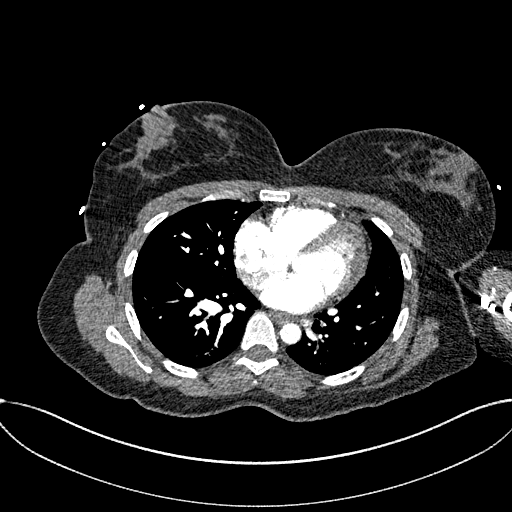
[im 152/278  lung]
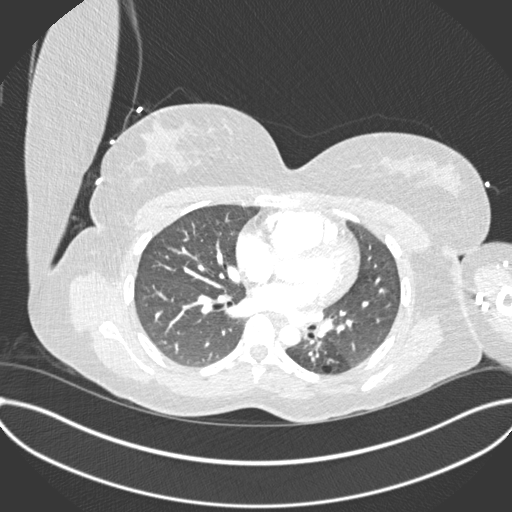
[im 177/278  soft-tissue]
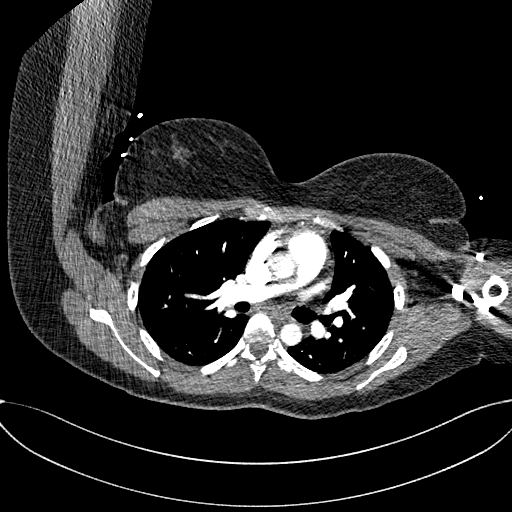
[im 189/278  lung]
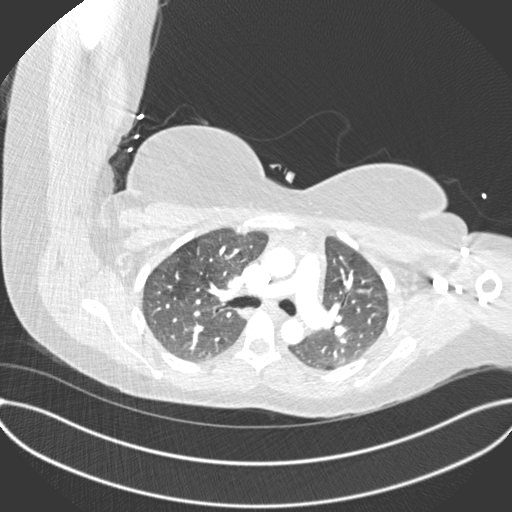
[im 215/278  soft-tissue]
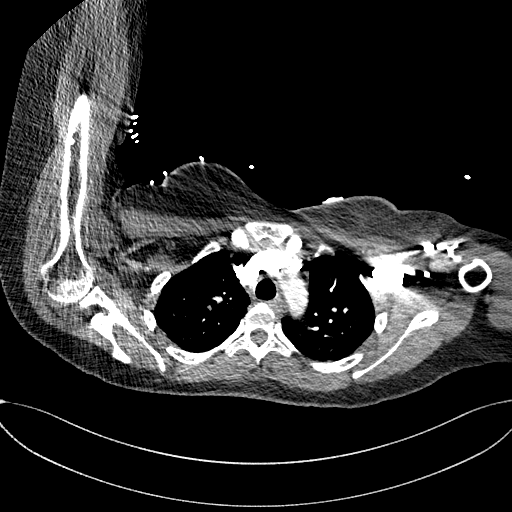
[im 227/278  lung]
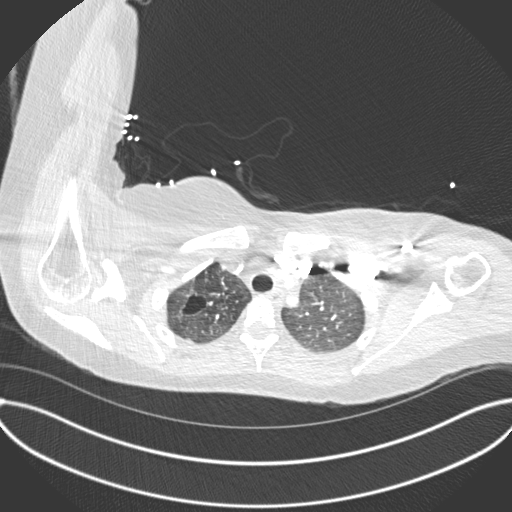
[im 240/278  soft-tissue]
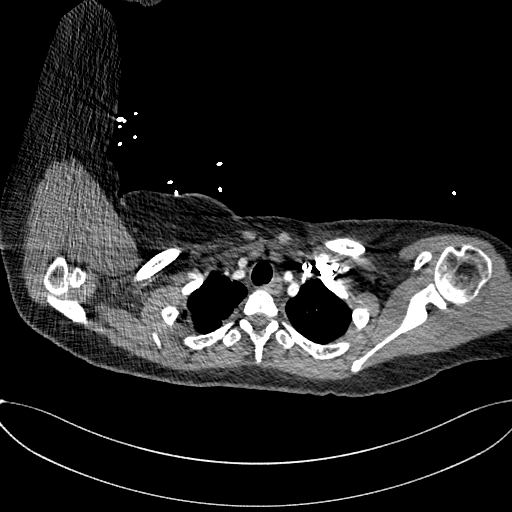
[im 265/278  lung]
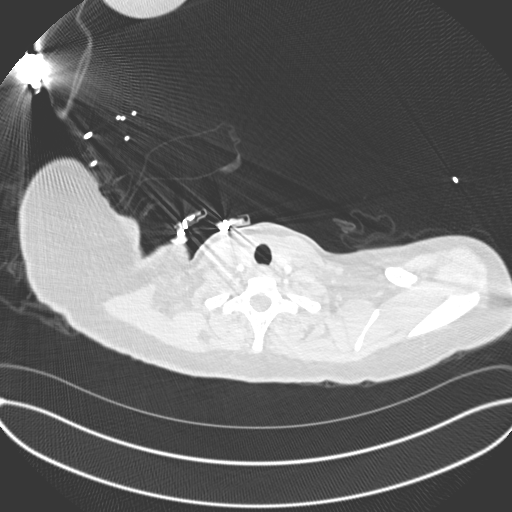

[Series 8: coronal mpr · coronal · 0.55mm/px · 3 of 128 slices shown]
[im 32/128  soft-tissue]
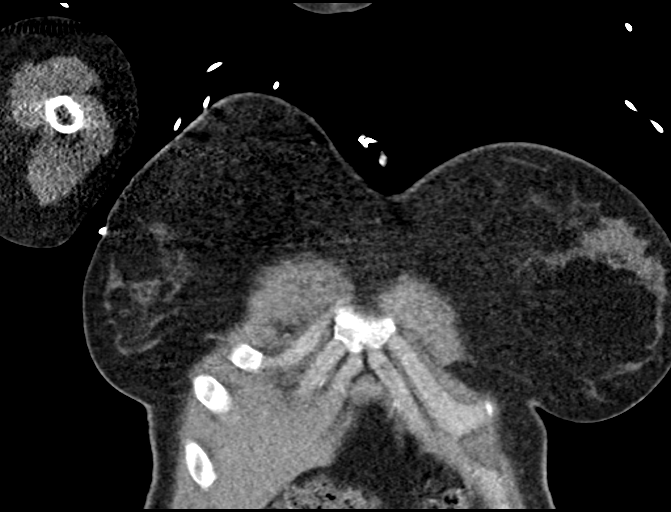
[im 64/128  soft-tissue]
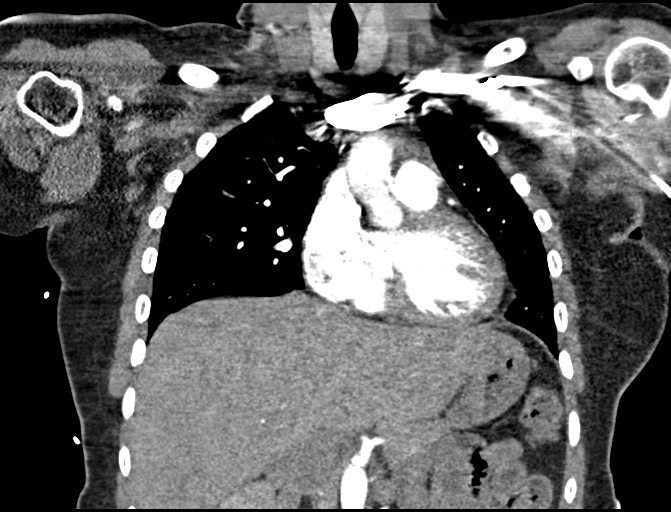
[im 96/128  soft-tissue]
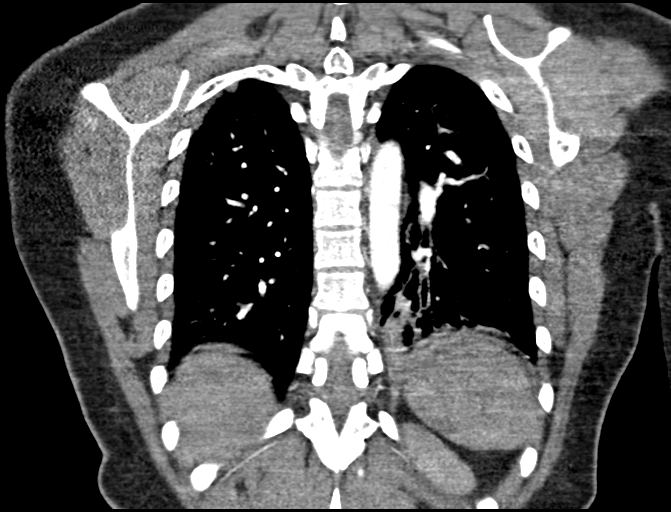

[18 of 46 positions shown; findings below may reference images not displayed]

FINDINGS: Cardiovascular: The quality of this exam for evaluation of pulmonary
embolism is moderate. Primary limitation is patient arm position,
not raised above the head. The bolus is relatively well timed. No
evidence of pulmonary embolism.

Normal aortic caliber. Normal heart size, without pericardial
effusion.

Mediastinum/Nodes: No mediastinal or hilar adenopathy.

Lungs/Pleura: No pleural fluid. Left lower lobe volume loss with
cylindrical bronchiectasis, including on image 59/7. Cystic
bronchiectasis in the right apex which is mild including on image
[DATE]

Upper Abdomen: Normal imaged portions of the liver, spleen, stomach,
pancreas, gallbladder, adrenal glands, kidneys.

Musculoskeletal: No acute osseous abnormality.

Review of the MIP images confirms the above findings.
IMPRESSION: 1. No evidence of pulmonary embolism. Moderate quality exam, with
limitations secondary to patient arm position.
2. No other explanation for patient's symptoms.
3. Left lower and right apical bronchiectasis with left lower lobe
volume loss. Findings are likely related to remote infection.
Superimposed acute infection within the left lower lobe is felt
unlikely but cannot be entirely excluded.

## 2019-12-07 IMAGING — CR DG CHEST 2V
2 series · 2 of 2 positions shown · non-contrast
Comparison: None.

CLINICAL DATA: Chest pain

EXAM:
CHEST - 2 VIEW

[chest pa]
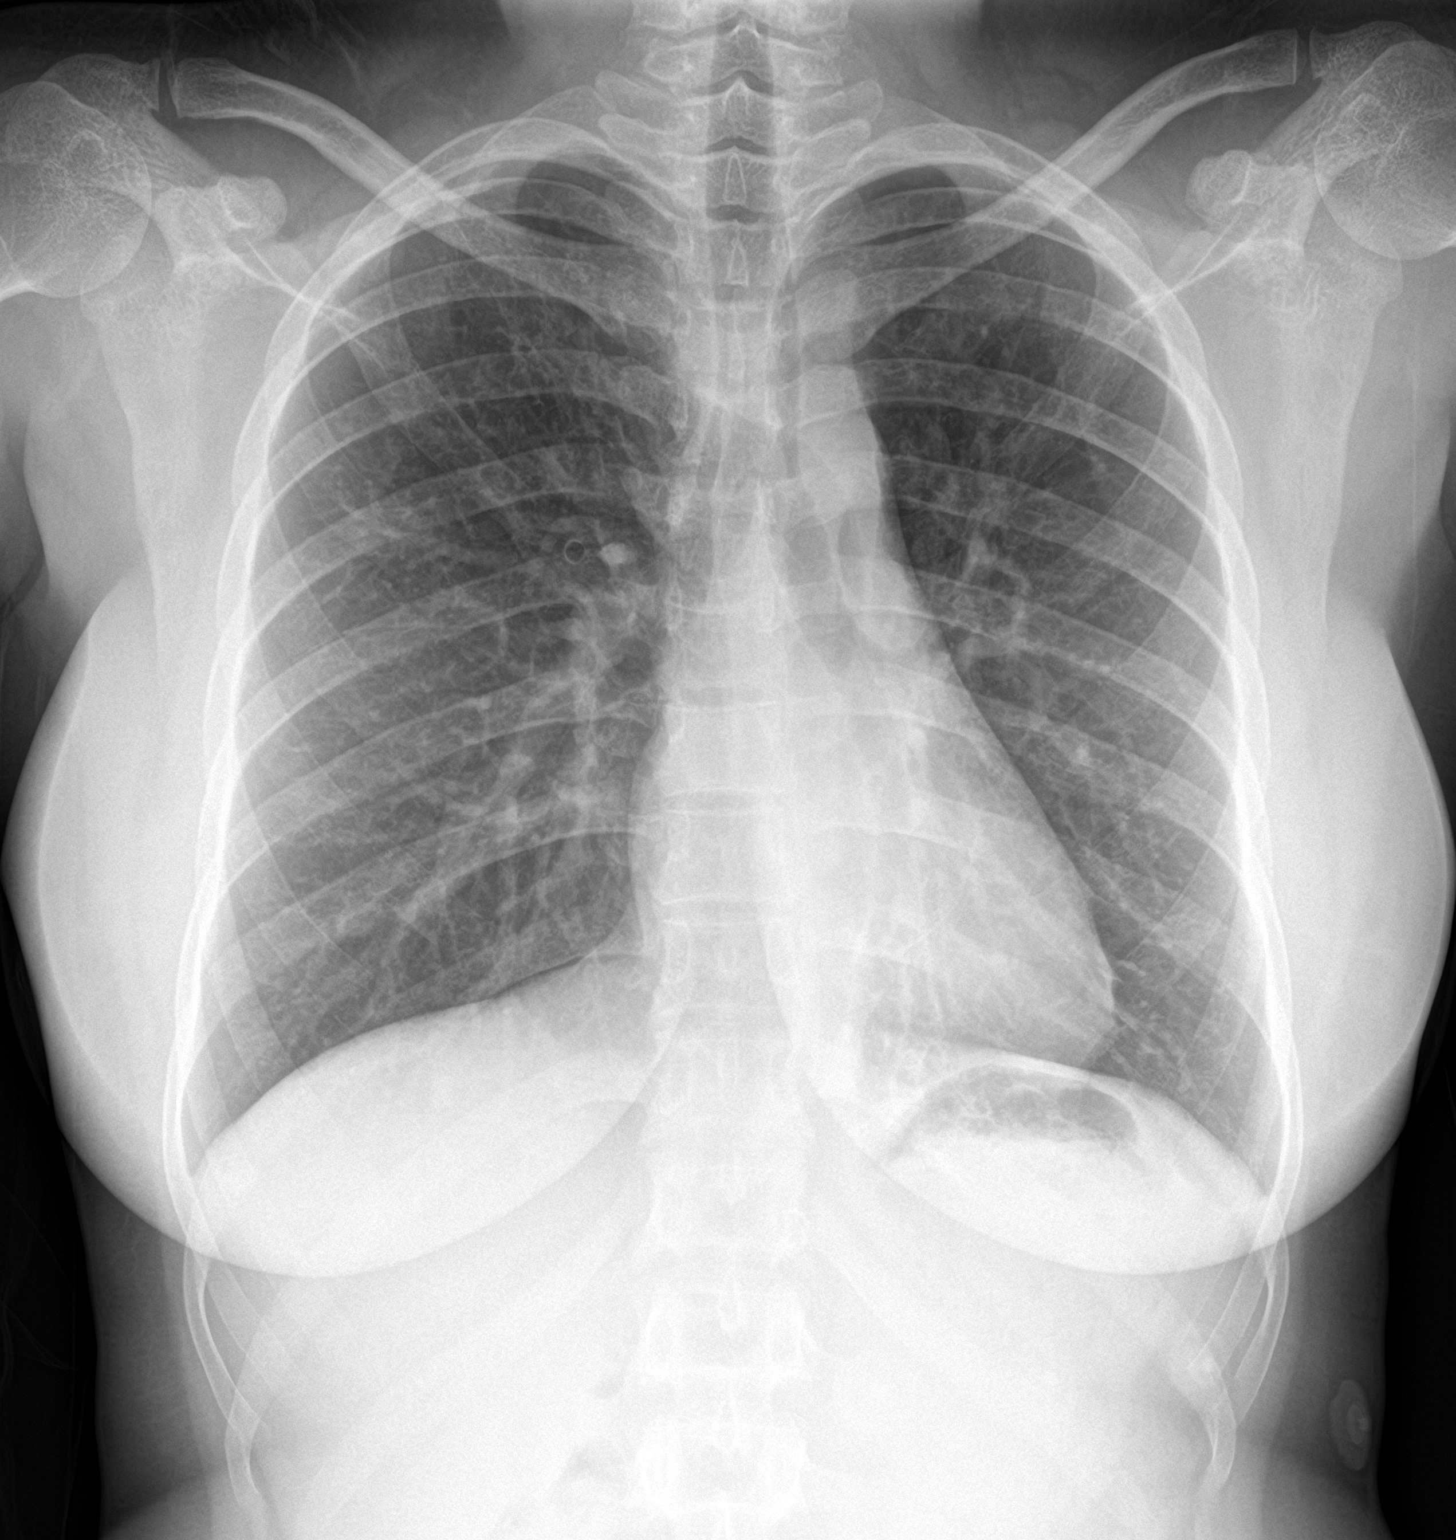

[chest lat]
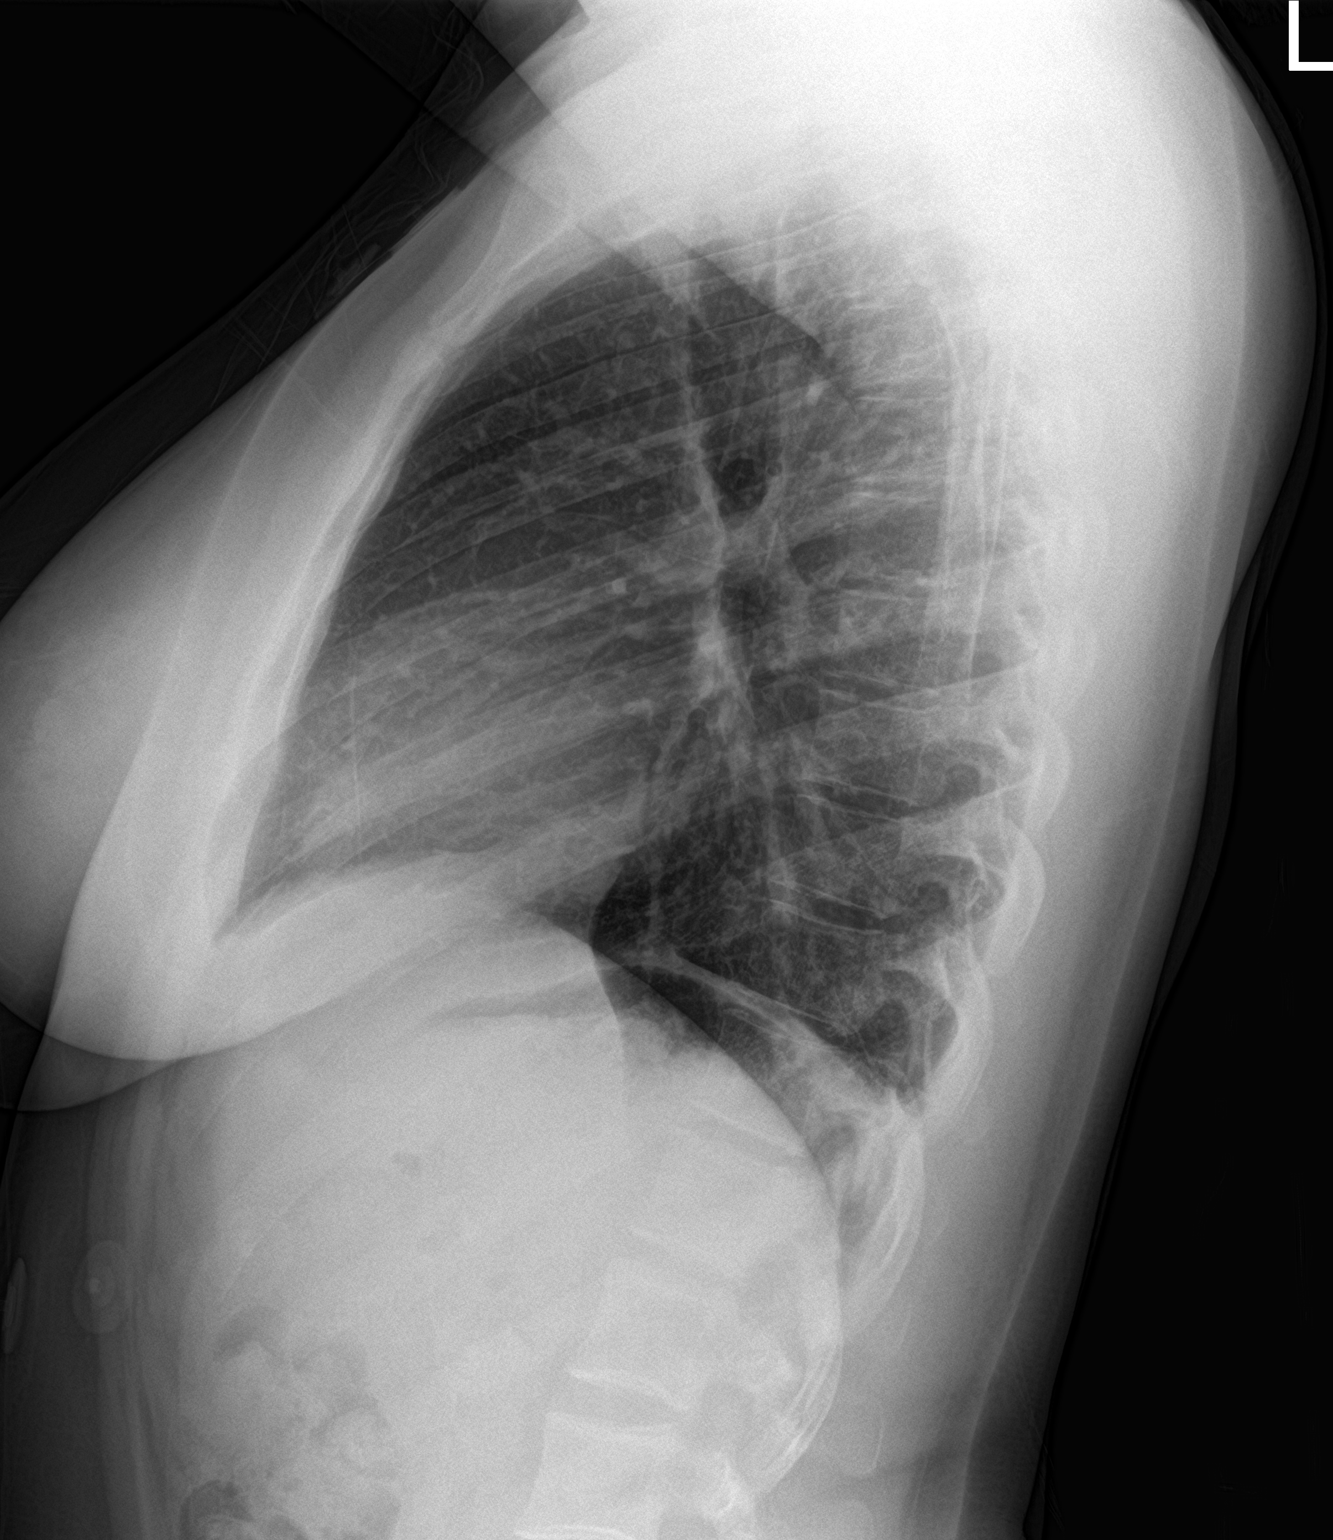

[2 of 2 positions shown; findings below may reference images not displayed]

FINDINGS: The heart size and mediastinal contours are within normal limits.
Both lungs are clear. The visualized skeletal structures are
unremarkable.
IMPRESSION: No active cardiopulmonary disease.

## 2021-10-27 ENCOUNTER — Inpatient Hospital Stay (HOSPITAL_COMMUNITY)
Admission: AD | Admit: 2021-10-27 | Discharge: 2021-10-27 | Disposition: A | Discharge status: Home / Self Care | Payer: BC Managed Care – PPO | Attending: Obstetrics and Gynecology | Admitting: Obstetrics and Gynecology

## 2021-10-27 DIAGNOSIS — N939 Abnormal uterine and vaginal bleeding, unspecified: Secondary | ICD-10-CM | POA: Diagnosis present

## 2021-10-27 DIAGNOSIS — Z3202 Encounter for pregnancy test, result negative: Secondary | ICD-10-CM | POA: Insufficient documentation

## 2021-10-27 DIAGNOSIS — R58 Hemorrhage, not elsewhere classified: Secondary | ICD-10-CM

## 2021-10-27 DIAGNOSIS — R1030 Lower abdominal pain, unspecified: Secondary | ICD-10-CM | POA: Insufficient documentation

## 2021-10-27 NOTE — Discharge Instructions (Signed)
Prenatal Care Providers           [facility]  [address] [phone]  [facility]   [address]  [phone]  [facility] @ [place]       [address] [phone]            [facility] @ [place]     [address] [phone]          [facility] @ [place]   [address] [phone]  [facility] @ [facility]  [address] [phone]     [facility]  [address]   [phone]     [facility]  Phone: [phone]  [facility]  Phone: [phone]  [facility] Phone: [phone]  [facility] Phone: [phone]  [facility] Phone: [phone]  [facility] Phone: [phone]  [facility]  Phone: [phone]  

## 2021-10-27 NOTE — MAU Provider Note (Signed)
Event Date/Time   First Provider Initiated Contact with Patient 10/27/21 0448      S Ms. Sherri Warner is a 31 y.o. [id number] patient who presents to MAU today with complaint of vaginal bleeding and concern for pregnancy. Patient endorses conflicting pregnancy test results: Negative on 08/31, Positive on 09/05. Patient also reports lower abdominal pain 7/10. She denies aggravating or alleviating factors. LMP 08/08/2021. When asked if her current bleeding could be her period, patient responded that she is only seeing blood when she wipes after voiding and her periods are usually heavier.  O BP 116/88 (BP Location: Right Arm)   Pulse 95   Temp 98.2 F (36.8 C) (Oral)   Resp 17   Ht 5\' 6"  (1.676 m)   Wt 91.9 kg   SpO2 100%   BMI 32.72 kg/m    Physical Exam Vitals and nursing note reviewed.  Constitutional:      General: She is not in acute distress.    Appearance: Normal appearance. She is not ill-appearing.  Cardiovascular:     Rate and Rhythm: Normal rate.     Pulses: Normal pulses.  Pulmonary:     Effort: Pulmonary effort is normal.  Neurological:     Mental Status: She is alert.     A Medical screening exam complete Negative pregnancy test in MAU Quant hCG declined by patient  P Discharge from MAU in stable condition Patient given the option of transfer to [facility] for further evaluation or seek care in outpatient facility of choice  List of options for follow-up given  Warning signs for worsening condition that would warrant emergency follow-up discussed Patient may return to MAU as needed   [facility], [facility] 10/27/2021 6:43 AM

## 2021-10-27 NOTE — MAU Note (Signed)
..  Sherri Warner is a 31 y.o. at Unknown here in MAU reporting: vaginal bleeding and abdominal cramping that began around 4 hours ago. Reports now she is bleeding only when she wipes and does not get on her pad.  Had a positive home pregnancy test around 2-3 weeks ago.  She went to urgent care on 10/14/2021 and the pregnancy test there was negative, but she took one at home on 10/19/2021 and it was positive.  LMP: 08/08/2021  Pain score: 7/10 Vitals:   10/27/21 0445  BP: 116/88  Pulse: 95  Resp: 17  Temp: 98.2 F (36.8 C)  SpO2: 100%      Lab orders placed from triage:  POCT pregnancy test: negative.

## 2024-01-19 ENCOUNTER — Ambulatory Visit: Payer: Self-pay | Admitting: Cardiology

## 2024-03-01 ENCOUNTER — Ambulatory Visit: Payer: Self-pay | Admitting: Cardiology
